# Patient Record
Sex: Male | Born: 1965 | Race: Black or African American | Hispanic: No | Marital: Single | Smoking: Never smoker
Health system: Southern US, Community
[De-identification: ages and names within clinical notes are randomized; demographics above are authoritative.]

## PROBLEM LIST (undated history)

## (undated) DIAGNOSIS — E669 Obesity, unspecified: Secondary | ICD-10-CM

## (undated) DIAGNOSIS — E119 Type 2 diabetes mellitus without complications: Secondary | ICD-10-CM

## (undated) DIAGNOSIS — F32A Depression, unspecified: Secondary | ICD-10-CM

## (undated) DIAGNOSIS — G629 Polyneuropathy, unspecified: Secondary | ICD-10-CM

## (undated) DIAGNOSIS — I10 Essential (primary) hypertension: Secondary | ICD-10-CM

## (undated) DIAGNOSIS — F329 Major depressive disorder, single episode, unspecified: Secondary | ICD-10-CM

## (undated) DIAGNOSIS — M549 Dorsalgia, unspecified: Secondary | ICD-10-CM

## (undated) HISTORY — PX: CHOLECYSTECTOMY: SHX55

---

## 2014-07-31 ENCOUNTER — Ambulatory Visit: Payer: Self-pay | Admitting: Family Medicine

## 2014-07-31 LAB — URINALYSIS, COMPLETE
BACTERIA: NEGATIVE
Bilirubin,UR: NEGATIVE
Blood: NEGATIVE
Glucose,UR: >=1000
Ketone: NEGATIVE
Leukocyte Esterase: NEGATIVE
NITRITE: NEGATIVE
PROTEIN: NEGATIVE
Ph: 6.5 (ref 5.0–8.0)
SPECIFIC GRAVITY: 1.015 (ref 1.000–1.030)
SQUAMOUS EPITHELIAL: NONE SEEN
WBC UR: NONE SEEN /HPF (ref 0–5)

## 2014-08-02 LAB — URINE CULTURE

## 2014-09-13 ENCOUNTER — Ambulatory Visit: Payer: Self-pay | Admitting: Family Medicine

## 2014-09-13 LAB — URINALYSIS, COMPLETE
BACTERIA: NEGATIVE
BILIRUBIN, UR: NEGATIVE
BLOOD: NEGATIVE
KETONE: NEGATIVE
Leukocyte Esterase: NEGATIVE
NITRITE: NEGATIVE
Ph: 5.5 (ref 5.0–8.0)
Protein: NEGATIVE
RBC, UR: NONE SEEN /HPF (ref 0–5)
SPECIFIC GRAVITY: 1.005 (ref 1.000–1.030)
Squamous Epithelial: NONE SEEN

## 2015-01-22 ENCOUNTER — Ambulatory Visit
Admit: 2015-01-22 | Disposition: A | Discharge status: Home / Self Care | Payer: Self-pay | Attending: Family Medicine | Admitting: Family Medicine

## 2015-01-29 ENCOUNTER — Encounter: Payer: Self-pay | Admitting: Emergency Medicine

## 2015-01-29 ENCOUNTER — Ambulatory Visit
Admission: EM | Admit: 2015-01-29 | Discharge: 2015-01-29 | Disposition: A | Discharge status: Home / Self Care | Payer: BLUE CROSS/BLUE SHIELD | Attending: Family Medicine | Admitting: Family Medicine

## 2015-01-29 DIAGNOSIS — M5432 Sciatica, left side: Secondary | ICD-10-CM

## 2015-01-29 HISTORY — DX: Obesity, unspecified: E66.9

## 2015-01-29 HISTORY — DX: Dorsalgia, unspecified: M54.9

## 2015-01-29 HISTORY — DX: Essential (primary) hypertension: I10

## 2015-01-29 HISTORY — DX: Type 2 diabetes mellitus without complications: E11.9

## 2015-01-29 LAB — CBG MONITORING, ED: WEAK D: 247

## 2015-01-29 MED ORDER — KETOROLAC TROMETHAMINE 60 MG/2ML IM SOLN
60.0000 mg | Freq: Once | INTRAMUSCULAR | Status: AC
  Administered 2015-01-29: 60 mg via INTRAMUSCULAR
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
## 2015-01-29 NOTE — Discharge Instructions (Signed)

## 2015-07-25 ENCOUNTER — Encounter: Payer: Self-pay | Admitting: Physical Medicine & Rehabilitation

## 2015-08-16 ENCOUNTER — Encounter: Payer: BLUE CROSS/BLUE SHIELD | Admitting: Physical Medicine & Rehabilitation

## 2015-09-04 ENCOUNTER — Encounter: Payer: Self-pay | Admitting: Physical Medicine & Rehabilitation

## 2015-10-05 ENCOUNTER — Encounter
Payer: BLUE CROSS/BLUE SHIELD | Attending: Physical Medicine & Rehabilitation | Admitting: Physical Medicine & Rehabilitation

## 2016-08-17 ENCOUNTER — Emergency Department (HOSPITAL_BASED_OUTPATIENT_CLINIC_OR_DEPARTMENT_OTHER)
Admission: EM | Admit: 2016-08-17 | Discharge: 2016-08-18 | Disposition: A | Discharge status: Home / Self Care | Payer: Worker's Compensation | Attending: Emergency Medicine | Admitting: Emergency Medicine

## 2016-08-17 ENCOUNTER — Emergency Department (HOSPITAL_BASED_OUTPATIENT_CLINIC_OR_DEPARTMENT_OTHER): Payer: Worker's Compensation

## 2016-08-17 ENCOUNTER — Encounter (HOSPITAL_BASED_OUTPATIENT_CLINIC_OR_DEPARTMENT_OTHER): Payer: Self-pay | Admitting: Emergency Medicine

## 2016-08-17 DIAGNOSIS — Y9389 Activity, other specified: Secondary | ICD-10-CM | POA: Diagnosis not present

## 2016-08-17 DIAGNOSIS — Y999 Unspecified external cause status: Secondary | ICD-10-CM | POA: Diagnosis not present

## 2016-08-17 DIAGNOSIS — Y92009 Unspecified place in unspecified non-institutional (private) residence as the place of occurrence of the external cause: Secondary | ICD-10-CM | POA: Diagnosis not present

## 2016-08-17 DIAGNOSIS — E119 Type 2 diabetes mellitus without complications: Secondary | ICD-10-CM | POA: Insufficient documentation

## 2016-08-17 DIAGNOSIS — Z77098 Contact with and (suspected) exposure to other hazardous, chiefly nonmedicinal, chemicals: Secondary | ICD-10-CM

## 2016-08-17 DIAGNOSIS — I10 Essential (primary) hypertension: Secondary | ICD-10-CM | POA: Diagnosis not present

## 2016-08-17 DIAGNOSIS — R0789 Other chest pain: Secondary | ICD-10-CM | POA: Insufficient documentation

## 2016-08-17 DIAGNOSIS — H5711 Ocular pain, right eye: Secondary | ICD-10-CM | POA: Diagnosis present

## 2016-08-17 MED ORDER — TETRACAINE HCL 0.5 % OP SOLN
2.0000 [drp] | Freq: Once | OPHTHALMIC | Status: AC
  Administered 2016-08-17: 2 [drp] via OPHTHALMIC

## 2016-08-17 MED ORDER — FLUORESCEIN SODIUM 1 MG OP STRP
1.0000 | ORAL_STRIP | Freq: Once | OPHTHALMIC | Status: AC
  Administered 2016-08-17: 1 via OPHTHALMIC

## 2016-08-17 MED ORDER — TETRACAINE HCL 0.5 % OP SOLN
OPHTHALMIC | Status: AC
  Filled 2016-08-17: qty 4

## 2016-08-17 MED ORDER — FLUORESCEIN SODIUM 1 MG OP STRP
ORAL_STRIP | OPHTHALMIC | Status: AC
  Filled 2016-08-17: qty 1

## 2016-08-17 MED ORDER — ERYTHROMYCIN 5 MG/GM OP OINT
TOPICAL_OINTMENT | Freq: Once | OPHTHALMIC | Status: AC
  Administered 2016-08-18: 1 via OPHTHALMIC
  Filled 2016-08-17: qty 3.5
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
## 2016-08-18 NOTE — Discharge Instructions (Signed)
Use eye ointment 4 times daily applying 1in to lower lid for 1 week or for the duration you have symptoms.

## 2016-11-11 ENCOUNTER — Ambulatory Visit
Admission: EM | Admit: 2016-11-11 | Discharge: 2016-11-11 | Disposition: A | Discharge status: Home / Self Care | Payer: BLUE CROSS/BLUE SHIELD | Attending: Family Medicine | Admitting: Family Medicine

## 2016-11-11 ENCOUNTER — Encounter: Payer: Self-pay | Admitting: *Deleted

## 2016-11-11 DIAGNOSIS — J0100 Acute maxillary sinusitis, unspecified: Secondary | ICD-10-CM | POA: Diagnosis not present

## 2016-11-11 DIAGNOSIS — J069 Acute upper respiratory infection, unspecified: Secondary | ICD-10-CM

## 2016-11-11 MED ORDER — AMOXICILLIN-POT CLAVULANATE 875-125 MG PO TABS: 1.0000 | ORAL_TABLET | Freq: Two times a day (BID) | ORAL | 0 refills | Status: DC

## 2016-11-11 MED ORDER — FEXOFENADINE-PSEUDOEPHED ER 180-240 MG PO TB24: 1.0000 | ORAL_TABLET | Freq: Every day | ORAL | 0 refills | Status: AC

## 2016-11-11 MED ORDER — FEXOFENADINE-PSEUDOEPHED ER 180-240 MG PO TB24: 1.0000 | ORAL_TABLET | Freq: Every day | ORAL | 0 refills | Status: DC

## 2016-11-11 NOTE — ED Triage Notes (Signed)
Patient started having symptoms of cough, nasal congestion, and weakness 1 week ago. Patient went to his PCP 6 days ago and was given cough suppressant medical. Patient still has symptoms that have not improved.
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
## 2016-11-12 ENCOUNTER — Telehealth: Payer: Self-pay

## 2016-11-12 MED ORDER — AMOXICILLIN 875 MG PO TABS: 875.0000 mg | ORAL_TABLET | Freq: Two times a day (BID) | ORAL | 0 refills | Status: DC

## 2016-11-12 NOTE — Telephone Encounter (Signed)
Patient called today stating that he could not afford his augmentin and would like to have amoxicillin sent in to pharmacy secondary to cost. Dr. Judd Gaudieronty approved this change.

## 2017-05-21 ENCOUNTER — Encounter: Payer: Self-pay | Admitting: Emergency Medicine

## 2017-05-21 ENCOUNTER — Emergency Department
Admission: EM | Admit: 2017-05-21 | Discharge: 2017-05-21 | Disposition: A | Discharge status: Home / Self Care | Payer: Managed Care, Other (non HMO) | Attending: Emergency Medicine | Admitting: Emergency Medicine

## 2017-05-21 DIAGNOSIS — Z79899 Other long term (current) drug therapy: Secondary | ICD-10-CM | POA: Insufficient documentation

## 2017-05-21 DIAGNOSIS — R531 Weakness: Secondary | ICD-10-CM | POA: Diagnosis not present

## 2017-05-21 DIAGNOSIS — Z794 Long term (current) use of insulin: Secondary | ICD-10-CM | POA: Insufficient documentation

## 2017-05-21 DIAGNOSIS — F329 Major depressive disorder, single episode, unspecified: Secondary | ICD-10-CM | POA: Diagnosis not present

## 2017-05-21 DIAGNOSIS — R739 Hyperglycemia, unspecified: Secondary | ICD-10-CM

## 2017-05-21 DIAGNOSIS — E1165 Type 2 diabetes mellitus with hyperglycemia: Secondary | ICD-10-CM | POA: Insufficient documentation

## 2017-05-21 DIAGNOSIS — I10 Essential (primary) hypertension: Secondary | ICD-10-CM | POA: Insufficient documentation

## 2017-05-21 HISTORY — DX: Depression, unspecified: F32.A

## 2017-05-21 HISTORY — DX: Polyneuropathy, unspecified: G62.9

## 2017-05-21 HISTORY — DX: Major depressive disorder, single episode, unspecified: F32.9

## 2017-05-21 LAB — CBC
HCT: 43.6 % (ref 40.0–52.0)
Hemoglobin: 15.2 g/dL (ref 13.0–18.0)
MCH: 30.6 pg (ref 26.0–34.0)
MCHC: 34.9 g/dL (ref 32.0–36.0)
MCV: 87.6 fL (ref 80.0–100.0)
Platelets: 231 10*3/uL (ref 150–440)
RBC: 4.98 MIL/uL (ref 4.40–5.90)
RDW: 13.5 % (ref 11.5–14.5)
WBC: 8.5 10*3/uL (ref 3.8–10.6)

## 2017-05-21 LAB — COMPREHENSIVE METABOLIC PANEL
ALT: 24 U/L (ref 17–63)
AST: 21 U/L (ref 15–41)
Albumin: 3.8 g/dL (ref 3.5–5.0)
Alkaline Phosphatase: 90 U/L (ref 38–126)
Anion gap: 7 (ref 5–15)
BUN: 12 mg/dL (ref 6–20)
CHLORIDE: 103 mmol/L (ref 101–111)
CO2: 25 mmol/L (ref 22–32)
CREATININE: 0.91 mg/dL (ref 0.61–1.24)
Calcium: 8.9 mg/dL (ref 8.9–10.3)
Glucose, Bld: 308 mg/dL — ABNORMAL HIGH (ref 65–99)
Potassium: 3.9 mmol/L (ref 3.5–5.1)
Sodium: 135 mmol/L (ref 135–145)
Total Bilirubin: 0.7 mg/dL (ref 0.3–1.2)
Total Protein: 7.4 g/dL (ref 6.5–8.1)

## 2017-05-21 LAB — GLUCOSE, CAPILLARY: GLUCOSE-CAPILLARY: 291 mg/dL — AB (ref 65–99)

## 2017-05-21 MED ORDER — HYDROCHLOROTHIAZIDE 12.5 MG PO CAPS
12.5000 mg | ORAL_CAPSULE | Freq: Once | ORAL | Status: AC
  Administered 2017-05-21: 12.5 mg via ORAL

## 2017-05-21 MED ORDER — ENALAPRIL MALEATE 10 MG PO TABS: 10.0000 mg | ORAL_TABLET | Freq: Every day | ORAL | 1 refills | Status: DC

## 2017-05-21 MED ORDER — METFORMIN HCL 500 MG PO TABS: 500.0000 mg | ORAL_TABLET | Freq: Two times a day (BID) | ORAL | 1 refills | Status: AC

## 2017-05-21 MED ORDER — HYDROCHLOROTHIAZIDE 12.5 MG PO CAPS
ORAL_CAPSULE | ORAL | Status: AC
  Administered 2017-05-21: 12.5 mg via ORAL
  Filled 2017-05-21: qty 1

## 2017-05-21 MED ORDER — ENALAPRIL-HYDROCHLOROTHIAZIDE 10-25 MG PO TABS: 1.0000 | ORAL_TABLET | Freq: Every day | ORAL | 0 refills | Status: AC

## 2017-05-21 MED ORDER — METFORMIN HCL 500 MG PO TABS
500.0000 mg | ORAL_TABLET | Freq: Once | ORAL | Status: AC
  Administered 2017-05-21: 500 mg via ORAL
  Filled 2017-05-21: qty 1

## 2017-05-21 MED ORDER — LOVASTATIN 10 MG PO TABS: 10.0000 mg | ORAL_TABLET | Freq: Every day | ORAL | 1 refills | Status: AC

## 2017-05-21 MED ORDER — ENALAPRIL MALEATE 10 MG PO TABS
10.0000 mg | ORAL_TABLET | Freq: Once | ORAL | Status: AC
  Administered 2017-05-21: 10 mg via ORAL
  Filled 2017-05-21: qty 1

## 2017-05-21 MED ORDER — SODIUM CHLORIDE 0.9 % IV BOLUS (SEPSIS)
1000.0000 mL | Freq: Once | INTRAVENOUS | Status: AC
  Administered 2017-05-21: 1000 mL via INTRAVENOUS

## 2017-05-21 NOTE — ED Notes (Signed)
Reviewed d/c instructions, follow-up care, prescriptions with patient. Pt verbalized understanding.  Patient told to continue both insulin and metformin per MD Dolores Frame
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
## 2017-05-21 NOTE — ED Provider Notes (Addendum)
[facility] Emergency Department Provider Note  Time seen: 9:00 PM  I have reviewed the triage vital signs and the nursing notes.   HISTORY  Chief Complaint Hyperglycemia and Otalgia    HPI Jason Clarke is a 51 y.o. male with a past medical history of diabetes, depression, hypertension, presents to the emergency department for multiple complaints. According to the patient he has been off his blood pressure and diabetic medications for the past 3 months as he did not have insurance but now does. States he has been feeling somewhat weak and fatigued over the past several days. He has been urinating very frequently over the past few days. He also states over the past 1-2 weeks he has felt like his depression has increased although denies any SI or HI. Patient denies any chest pain, shortness breath abdominal pain, nausea vomiting or diarrhea. Denies dysuria. Patient states the main reason he came was to get his medications filled and to be evaluated for his high blood sugar. When asked if he wants to speak with someone regarding his depression, he says that he just wants to get a psychiatrist to see as an outpatient.  Past Medical History:  Diagnosis Date  . Back pain   . Depression   . Diabetes mellitus without complication (HCC)   . Hypertension   . Neuropathy   . Obesity     There are no active problems to display for this patient.   Past Surgical History:  Procedure Laterality Date  . CHOLECYSTECTOMY      Prior to Admission medications   Medication Sig Start Date End Date Taking? Authorizing Provider  amoxicillin (AMOXIL) 875 MG tablet Take 1 tablet (875 mg total) by mouth 2 (two) times daily. 11/12/16   Payton Mccallum, MD  amoxicillin-clavulanate (AUGMENTIN) 875-125 MG tablet Take 1 tablet by mouth 2 (two) times daily. 11/11/16   Hassan Rowan, MD  aspirin 81 MG tablet Take 81 mg by mouth daily.    [provider]  enalapril (VASOTEC) 10 MG  tablet Take 10 mg by mouth daily.    [provider]  fexofenadine-pseudoephedrine (ALLEGRA-D ALLERGY & CONGESTION) 180-240 MG 24 hr tablet Take 1 tablet by mouth daily. 11/11/16   Hassan Rowan, MD  fluticasone Aleda Grana) 50 MCG/ACT nasal spray Place into both nostrils daily.    [provider]  furosemide (LASIX) 10 MG/ML solution Take by mouth daily.    [provider]  insulin glargine (LANTUS) 100 UNIT/ML injection Inject into the skin at bedtime.    [provider]  loratadine (CLARITIN) 10 MG tablet Take 10 mg by mouth daily.    [provider]  lovastatin (MEVACOR) 10 MG tablet Take 10 mg by mouth at bedtime.    [provider]  metFORMIN (GLUCOPHAGE) 500 MG tablet Take by mouth 2 (two) times daily with a meal.    [provider]    No Known Allergies  Family History  Problem Relation Age of Onset  . Diabetes Mother   . Hypertension Mother   . Diabetes Father   . Hypertension Father   . Diabetes Maternal Grandmother   . Hypertension Maternal Grandmother   . Diabetes Paternal Grandmother   . Hypertension Paternal Grandmother     Social History Social History  Substance Use Topics  . Smoking status: Never Smoker  . Smokeless tobacco: Never Used  . Alcohol use Yes     Comment: occ    Review of Systems Constitutional: Negative for  fever. Positive for generalized fatigue/weakness. Cardiovascular: Negative for chest pain. Respiratory: Negative for shortness of breath. Gastrointestinal: Negative for abdominal pain Genitourinary: Negative for dysuria Neurological: Negative for headache All other ROS negative  ____________________________________________   PHYSICAL EXAM:  VITAL SIGNS: ED Triage Vitals  Enc Vitals Group     BP 05/21/17 2044 (!) 202/110     Pulse Rate 05/21/17 2040 79     Resp 05/21/17 2040 18     Temp 05/21/17 2040 98.6 F (37 C)     Temp Source 05/21/17 2040 Oral     SpO2 05/21/17 2040  97 %     Weight 05/21/17 2040 280 lb (127 kg)     Height 05/21/17 2040 5\' 9"  (1.753 m)     Head Circumference --      Peak Flow --      Pain Score 05/21/17 2040 6     Pain Loc --      Pain Edu? --      Excl. in GC? --     Constitutional: Alert and oriented. Well appearing and in no distress. Eyes: Normal exam ENT   Head: Normocephalic and atraumatic.   Mouth/Throat: Mucous membranes are moist.Normal appearing right tympanic membrane. Cardiovascular: Normal rate, regular rhythm. No murmur Respiratory: Normal respiratory effort without tachypnea nor retractions. Breath sounds are clear  Gastrointestinal: Soft and nontender. No distention.   Musculoskeletal: Nontender with normal range of motion in all extremities.  Neurologic:  Normal speech and language. No gross focal neurologic deficits  Skin:  Skin is warm, dry and intact.  Psychiatric: Mood and affect are normal.   ____________________________________________    EKG  EKG reviewed and interpreted by myself shows normal sinus rhythm at 73 bpm, narrow QRS, normal axis, normal intervals, no concerning ST changes.   INITIAL IMPRESSION / ASSESSMENT AND PLAN / ED COURSE  Pertinent labs & imaging results that were available during my care of the patient were reviewed by me and considered in my medical decision making (see chart for details).  Patient presents to the emergency department for multiple complaints including hyperglycemia, weakness, hypertension, off of his medications, also states depression wishing for referrals to psychiatry as well as primary care. Overall the patient appears extremely well, no distress. Normal physical examination. Patient is hypertensive, we will restart blood pressure medications. We will check labs IV hydrate and continue to closely monitor. I very clearly asked the patient if he wishes to speak to a psychiatrist in the emergency department, he states he does not and only wishes for a  referral to an outpatient psychiatrist as well as a primary care doctor now that he has insurance. Clearly denies SI or HI.  Patient appears well. Labs are largely within normal limits with hyperglycemia. We will restart metformin as well as blood pressure medications and have him follow up with [facility] clinic. Patient is agreeable to plan.  ____________________________________________   FINAL CLINICAL IMPRESSION(S) / ED DIAGNOSES  Hyperglycemia Hypertension Depression    Minna Antis, MD 05/21/17 2255    Minna Antis, MD 05/21/17 2303

## 2017-05-21 NOTE — ED Triage Notes (Signed)
Patient here stating that his blood sugar has been in the 300's for over a month. Patient states that he has a history of depression and feels like he needs to see someone for it. Patient denies SI.  Patient states that he has not been taking his medication for DM or depression because he has not had insurance.

## 2017-05-29 ENCOUNTER — Ambulatory Visit
Admission: EM | Admit: 2017-05-29 | Discharge: 2017-05-29 | Disposition: A | Discharge status: Home / Self Care | Payer: Managed Care, Other (non HMO) | Attending: Family Medicine | Admitting: Family Medicine

## 2017-05-29 DIAGNOSIS — R739 Hyperglycemia, unspecified: Secondary | ICD-10-CM | POA: Diagnosis not present

## 2017-05-29 DIAGNOSIS — I10 Essential (primary) hypertension: Secondary | ICD-10-CM

## 2017-05-29 DIAGNOSIS — L02229 Furuncle of trunk, unspecified: Secondary | ICD-10-CM

## 2017-05-29 MED ORDER — ENALAPRIL MALEATE 10 MG PO TABS: 10.0000 mg | ORAL_TABLET | Freq: Every day | ORAL | 0 refills | Status: DC

## 2017-05-29 MED ORDER — DOXYCYCLINE HYCLATE 100 MG PO TABS: 100.0000 mg | ORAL_TABLET | Freq: Two times a day (BID) | ORAL | 0 refills | Status: DC

## 2017-05-29 NOTE — ED Triage Notes (Signed)
Pt reports boil to left lower back that is painful. Pt also reports CBG 200-300 for the several weeks and BP running higher than normal. Pt endorses generalized fatigue

## 2017-05-29 NOTE — ED Provider Notes (Signed)
[facility]    CSN: [id number] Arrival date & time: 05/29/17  1835     History   Chief Complaint Chief Complaint  Patient presents with  . Abscess  . Hyperglycemia  . Hypertension    HPI Jason Clarke is a 51 y.o. male.   51 yo diabetic male with a c/o boil on his lower back and elevated blood pressure readings. States boil on lower back drained some pus today. Denies any fevers or chills.    The history is provided by the patient.  Abscess  Hyperglycemia  Hypertension     Past Medical History:  Diagnosis Date  . Back pain   . Depression   . Diabetes mellitus without complication (HCC)   . Hypertension   . Neuropathy   . Obesity     There are no active problems to display for this patient.   Past Surgical History:  Procedure Laterality Date  . CHOLECYSTECTOMY         Home Medications    Prior to Admission medications   Medication Sig Start Date End Date Taking? Authorizing Provider  amoxicillin (AMOXIL) 875 MG tablet Take 1 tablet (875 mg total) by mouth 2 (two) times daily. 11/12/16   Payton Mccallum, MD  amoxicillin-clavulanate (AUGMENTIN) 875-125 MG tablet Take 1 tablet by mouth 2 (two) times daily. 11/11/16   Hassan Rowan, MD  aspirin 81 MG tablet Take 81 mg by mouth daily.    [provider]  doxycycline (VIBRA-TABS) 100 MG tablet Take 1 tablet (100 mg total) by mouth 2 (two) times daily. 05/29/17   Payton Mccallum, MD  enalapril (VASOTEC) 10 MG tablet Take 1 tablet (10 mg total) by mouth daily. 05/29/17   Payton Mccallum, MD  enalapril-hydrochlorothiazide (VASERETIC) 10-25 MG tablet Take 1 tablet by mouth daily. 05/21/17   Irean Hong, MD  fexofenadine-pseudoephedrine (ALLEGRA-D ALLERGY & CONGESTION) 180-240 MG 24 hr tablet Take 1 tablet by mouth daily. 11/11/16   Hassan Rowan, MD  fluticasone Aleda Grana) 50 MCG/ACT nasal spray Place into both nostrils daily.    [provider]  furosemide (LASIX) 10 MG/ML solution Take by  mouth daily.    [provider]  insulin glargine (LANTUS) 100 UNIT/ML injection Inject into the skin at bedtime.    [provider]  loratadine (CLARITIN) 10 MG tablet Take 10 mg by mouth daily.    [provider]  lovastatin (MEVACOR) 10 MG tablet Take 1 tablet (10 mg total) by mouth at bedtime. 05/21/17   Minna Antis, MD  metFORMIN (GLUCOPHAGE) 500 MG tablet Take 1 tablet (500 mg total) by mouth 2 (two) times daily with a meal. 05/21/17   Minna Antis, MD    Family History Family History  Problem Relation Age of Onset  . Diabetes Mother   . Hypertension Mother   . Diabetes Father   . Hypertension Father   . Diabetes Maternal Grandmother   . Hypertension Maternal Grandmother   . Diabetes Paternal Grandmother   . Hypertension Paternal Grandmother     Social History Social History  Substance Use Topics  . Smoking status: Never Smoker  . Smokeless tobacco: Never Used  . Alcohol use Yes     Comment: occ     Allergies   Patient has no known allergies.   Review of Systems Review of Systems   Physical Exam Triage Vital Signs ED Triage Vitals  Enc Vitals Group     BP 05/29/17 1857 (!) 167/92     Pulse  Rate 05/29/17 1857 77     Resp 05/29/17 1857 16     Temp 05/29/17 1857 98.2 F (36.8 C)     Temp src --      SpO2 05/29/17 1857 100 %     Weight 05/29/17 1857 283 lb (128.4 kg)     Height --      Head Circumference --      Peak Flow --      Pain Score 05/29/17 1924 0     Pain Loc --      Pain Edu? --      Excl. in GC? --    No data found.   Updated Vital Signs BP (!) 167/92   Pulse 77   Temp 98.2 F (36.8 C)   Resp 16   Wt 283 lb (128.4 kg)   SpO2 100%   BMI 41.79 kg/m   Visual Acuity Right Eye Distance:   Left Eye Distance:   Bilateral Distance:    Right Eye Near:   Left Eye Near:    Bilateral Near:     Physical Exam  Constitutional: He appears well-developed and well-nourished. No distress.    Cardiovascular: Normal rate, regular rhythm, normal heart sounds and intact distal pulses.   No murmur heard. Pulmonary/Chest: Effort normal and breath sounds normal. No respiratory distress. He has no wheezes. He has no rales.  Skin: He is not diaphoretic.  3cm slightly raised, tender, erythematous, blanchable skin lesion on lower back; no drainage  Nursing note and vitals reviewed.    UC Treatments / Results  Labs (all labs ordered are listed, but only abnormal results are displayed) Labs Reviewed - No data to display  EKG  EKG Interpretation None       Radiology No results found.  Procedures Procedures (including critical care time)  Medications Ordered in UC Medications - No data to display   Initial Impression / Assessment and Plan / UC Course  I have reviewed the triage vital signs and the nursing notes.  Pertinent labs & imaging results that were available during my care of the patient were reviewed by me and considered in my medical decision making (see chart for details).       Final Clinical Impressions(s) / UC Diagnoses   Final diagnoses:  Boil of trunk  Hyperglycemia  Essential hypertension    New Prescriptions Discharge Medication List as of 05/29/2017  7:19 PM    START taking these medications   Details  doxycycline (VIBRA-TABS) 100 MG tablet Take 1 tablet (100 mg total) by mouth 2 (two) times daily., Starting Fri 05/29/2017, Normal      1. diagnosis reviewed with patient 2. rx as per orders above; reviewed possible side effects, interactions, risks and benefits  3. Recommend add 10mg  enalapril to current meds 4. Recommend supportive treatment with warm compresses to affected skin area on lower back 4. Follow-up with PCP   Controlled Substance Prescriptions  Controlled Substance Registry consulted? Not Applicable   Payton Mccallumonty, Camylle Whicker, MD 05/29/17 [phone]

## 2017-05-29 NOTE — Discharge Instructions (Signed)
Warm compresses to affected area °

## 2017-07-09 ENCOUNTER — Ambulatory Visit
Admission: EM | Admit: 2017-07-09 | Discharge: 2017-07-09 | Disposition: A | Discharge status: Home / Self Care | Payer: Managed Care, Other (non HMO) | Attending: Family Medicine | Admitting: Family Medicine

## 2017-07-09 ENCOUNTER — Encounter: Payer: Self-pay | Admitting: Emergency Medicine

## 2017-07-09 DIAGNOSIS — R0789 Other chest pain: Secondary | ICD-10-CM | POA: Diagnosis not present

## 2017-07-09 DIAGNOSIS — R109 Unspecified abdominal pain: Secondary | ICD-10-CM | POA: Diagnosis not present

## 2017-07-09 DIAGNOSIS — K859 Acute pancreatitis without necrosis or infection, unspecified: Secondary | ICD-10-CM | POA: Diagnosis not present

## 2017-07-09 NOTE — Discharge Instructions (Signed)
Go to the [facility] ER.  Take care  Dr. Adriana Simas

## 2017-07-09 NOTE — ED Triage Notes (Signed)
Patient was seen at [facility] ED yesterday for this same pain.  Patient states that he is still having that abdominal pain, chest and jaw pain.  Patient states that he was told that he had an elevated lipase level.  Patient thinks he might need an ultrasound.

## 2017-07-09 NOTE — ED Provider Notes (Signed)
[facility]    CSN: [id number] Arrival date & time: 07/09/17  1205  History   Chief Complaint Chief Complaint  Patient presents with  . Abdominal Pain  . Chest Pain  . Jaw Pain   HPI  51 year old male presents with the above complaints.  Patient reports a one-week history of chest pain. He reports associated radiation to the neck and jaw. He was seen at [facility] ER and [facility] yesterday. He endorses to me that he's had ongoing abdominal discomfort as well. This is not mentioned in the notes. He had negative troponins 2. EKG did not reveal any signs of ischemia. Laboratory studies revealed elevated glucose and a markedly elevated lipase of 758. Note states that he had no epigastric pain on exam. Therefore he was discharged home. Patient reports that he continues to have ongoing chest discomfort and jaw pain. He also states that he's having abdominal pain. He has not had a bowel movement in 2 days. No fever or chills. No other associated symptoms. Pain is 8 out of 10 in severity. No relieving factors. No other complaints or concerns at this time.  Past Medical History:  Diagnosis Date  . Back pain   . Depression   . Diabetes mellitus without complication (HCC)   . Hypertension   . Neuropathy   . Obesity    Past Surgical History:  Procedure Laterality Date  . CHOLECYSTECTOMY      Home Medications    Prior to Admission medications   Medication Sig Start Date End Date Taking? Authorizing Provider  aspirin 81 MG tablet Take 81 mg by mouth daily.    [provider]  enalapril-hydrochlorothiazide (VASERETIC) 10-25 MG tablet Take 1 tablet by mouth daily. 05/21/17   Irean Hong, MD  fexofenadine-pseudoephedrine (ALLEGRA-D ALLERGY & CONGESTION) 180-240 MG 24 hr tablet Take 1 tablet by mouth daily. 11/11/16   Hassan Rowan, MD  fluticasone Aleda Grana) 50 MCG/ACT nasal spray Place into both nostrils daily.    [provider]  insulin glargine (LANTUS) 100 UNIT/ML  injection Inject 20 Units into the skin 2 (two) times daily.     [provider]  loratadine (CLARITIN) 10 MG tablet Take 10 mg by mouth daily.    [provider]  lovastatin (MEVACOR) 10 MG tablet Take 1 tablet (10 mg total) by mouth at bedtime. 05/21/17   Minna Antis, MD  metFORMIN (GLUCOPHAGE) 500 MG tablet Take 1 tablet (500 mg total) by mouth 2 (two) times daily with a meal. Patient taking differently: Take 1,000 mg by mouth 2 (two) times daily with a meal.  05/21/17   Minna Antis, MD   Family History Family History  Problem Relation Age of Onset  . Diabetes Mother   . Hypertension Mother   . Diabetes Father   . Hypertension Father   . Diabetes Maternal Grandmother   . Hypertension Maternal Grandmother   . Diabetes Paternal Grandmother   . Hypertension Paternal Grandmother     Social History Social History  Substance Use Topics  . Smoking status: Never Smoker  . Smokeless tobacco: Never Used  . Alcohol use Yes     Comment: occ   Allergies   Patient has no known allergies.  Review of Systems Review of Systems  HENT:       Jaw pain.  Cardiovascular: Positive for chest pain.  Gastrointestinal: Positive for abdominal pain.   Physical Exam Triage Vital Signs ED Triage Vitals  Enc Vitals Group     BP  07/09/17 1222 119/88     Pulse Rate 07/09/17 1222 95     Resp 07/09/17 1222 16     Temp 07/09/17 1222 98.2 F (36.8 C)     Temp Source 07/09/17 1222 Oral     SpO2 07/09/17 1222 99 %     Weight 07/09/17 1219 278 lb (126.1 kg)     Height 07/09/17 1219  (1.753 m)     Head Circumference --      Peak Flow --      Pain Score 07/09/17 1219 8     Pain Loc --      Pain Edu? --      Excl. in GC? --    Updated Vital Signs BP 119/88 (BP Location: Right Arm)   Pulse 95   Temp 98.2 F (36.8 C) (Oral)   Resp 16   Ht  (1.753 m)   Wt 278 lb (126.1 kg)   SpO2 99%   BMI 41.05 kg/m   Physical Exam  Constitutional: He is oriented to  person, place, and time. He appears well-developed. No distress.  Cardiovascular: Normal rate and regular rhythm.   No murmur heard. Pulmonary/Chest: Effort normal and breath sounds normal. He has no wheezes. He has no rales.  Abdominal:  Soft, nondistended. Patient has marketed epigastric abdominal pain.  Neurological: He is alert and oriented to person, place, and time.  Psychiatric: He has a normal mood and affect.  Vitals reviewed.  UC Treatments / Results  Labs (all labs ordered are listed, but only abnormal results are displayed) Labs Reviewed - No data to display  EKG  EKG Interpretation None       Radiology No results found.  Procedures Procedures (including critical care time)  Medications Ordered in UC Medications - No data to display   Initial Impression / Assessment and Plan / UC Course  I have reviewed the triage vital signs and the nursing notes.  Pertinent labs & imaging results that were available during my care of the patient were reviewed by me and considered in my medical decision making (see chart for details).   51 year old male presents with chest pain, abdominal pain, jaw pain. Had a negative cardiac workup yesterday. He has significant epigastric abdominal pain. Had an elevated lipase at ER. I suspect that he has acute pancreatitis. Sending to the ER.  Final Clinical Impressions(s) / UC Diagnoses   Final diagnoses:  Acute pancreatitis, unspecified complication status, unspecified pancreatitis type    New Prescriptions New Prescriptions   No medications on file   Controlled Substance Prescriptions [place] Controlled Substance Registry consulted? Not Applicable   Tommie Sams, DO 07/09/17 1256

## 2017-12-23 ENCOUNTER — Emergency Department
Admission: EM | Admit: 2017-12-23 | Discharge: 2017-12-23 | Disposition: A | Discharge status: Home / Self Care | Payer: Managed Care, Other (non HMO) | Attending: Emergency Medicine | Admitting: Emergency Medicine

## 2017-12-23 ENCOUNTER — Other Ambulatory Visit: Payer: Self-pay

## 2017-12-23 DIAGNOSIS — Z7982 Long term (current) use of aspirin: Secondary | ICD-10-CM | POA: Insufficient documentation

## 2017-12-23 DIAGNOSIS — I10 Essential (primary) hypertension: Secondary | ICD-10-CM | POA: Insufficient documentation

## 2017-12-23 DIAGNOSIS — E119 Type 2 diabetes mellitus without complications: Secondary | ICD-10-CM | POA: Insufficient documentation

## 2017-12-23 DIAGNOSIS — J069 Acute upper respiratory infection, unspecified: Secondary | ICD-10-CM | POA: Insufficient documentation

## 2017-12-23 DIAGNOSIS — Z7984 Long term (current) use of oral hypoglycemic drugs: Secondary | ICD-10-CM | POA: Insufficient documentation

## 2017-12-23 DIAGNOSIS — Z79899 Other long term (current) drug therapy: Secondary | ICD-10-CM | POA: Insufficient documentation

## 2017-12-23 MED ORDER — FLUTICASONE PROPIONATE 50 MCG/ACT NA SUSP: 2.0000 | Freq: Every day | NASAL | 0 refills | Status: AC

## 2017-12-23 MED ORDER — AZITHROMYCIN 250 MG PO TABS: ORAL_TABLET | ORAL | 0 refills | Status: AC

## 2017-12-23 MED ORDER — ACETAMINOPHEN-CODEINE #3 300-30 MG PO TABS: 1.0000 | ORAL_TABLET | Freq: Four times a day (QID) | ORAL | 0 refills | Status: AC | PRN

## 2017-12-23 MED ORDER — BENZONATATE 100 MG PO CAPS: ORAL_CAPSULE | ORAL | 0 refills | Status: AC

## 2017-12-23 NOTE — Discharge Instructions (Signed)
Take the prescription meds as directed. Your symptoms likely represent a viral URI. See your provider for ongoing symptoms.

## 2017-12-23 NOTE — ED Triage Notes (Signed)
Pt to ED c/o of cough sore throat body aches x4-5days ago. Pt recent exposure to sick people. Pt ambulatory.

## 2017-12-23 NOTE — ED Provider Notes (Signed)
[facility] Emergency Department Provider Note ____________________________________________  Time seen: 1310  I have reviewed the triage vital signs and the nursing notes.  HISTORY  Chief Complaint  URI  HPI Jason Clarke is a 52 y.o. male presents to the ED for evaluation of flulike symptoms.  Patient describes symptoms have been persistent for the last 4-5 days.  He has been taking over-the-counter cough medicines, Mucinex, Flonase, and his albuterol.  He describes body aches, congestion, ear fullness, sore throat, cough, and intermittent wheeze last night.  He denies any outright fevers.  He did receive the seasonal flu vaccine.  He has a medical history of mild intermittent asthma that he uses albuterol for as needed.  He denies any cough-induced vomiting, abdominal pain, or diarrhea.  He also denies any significant chest pain.  Past Medical History:  Diagnosis Date  . Back pain   . Depression   . Diabetes mellitus without complication (HCC)   . Hypertension   . Neuropathy   . Obesity     There are no active problems to display for this patient.   Past Surgical History:  Procedure Laterality Date  . CHOLECYSTECTOMY      Prior to Admission medications   Medication Sig Start Date End Date Taking? Authorizing Provider  acetaminophen-codeine (TYLENOL #3) 300-30 MG tablet Take 1 tablet by mouth every 6 (six) hours as needed for moderate pain. 12/23/17   Corrissa Martello, Charlesetta Ivory, PA-C  aspirin 81 MG tablet Take 81 mg by mouth daily.    [provider]  azithromycin (ZITHROMAX Z-PAK) 250 MG tablet Take 2 tablets (500 mg) on  Day 1,  followed by 1 tablet (250 mg) once daily on Days 2 through 5. 12/23/17   Mayukha Symmonds, Charlesetta Ivory, PA-C  benzonatate (TESSALON PERLES) 100 MG capsule Take 1-2 tabs TID prn cough 12/23/17   Jaquavion Mccannon, Charlesetta Ivory, PA-C  enalapril-hydrochlorothiazide (VASERETIC) 10-25 MG tablet Take 1 tablet by mouth daily. 05/21/17   Irean Hong, MD  fexofenadine-pseudoephedrine (ALLEGRA-D ALLERGY & CONGESTION) 180-240 MG 24 hr tablet Take 1 tablet by mouth daily. 11/11/16   Hassan Rowan, MD  fluticasone Aleda Grana) 50 MCG/ACT nasal spray Place into both nostrils daily.    [provider]  fluticasone (FLONASE) 50 MCG/ACT nasal spray Place 2 sprays into both nostrils daily. 12/23/17   Lareen Mullings, Charlesetta Ivory, PA-C  insulin glargine (LANTUS) 100 UNIT/ML injection Inject 20 Units into the skin 2 (two) times daily.     [provider]  loratadine (CLARITIN) 10 MG tablet Take 10 mg by mouth daily.    [provider]  lovastatin (MEVACOR) 10 MG tablet Take 1 tablet (10 mg total) by mouth at bedtime. 05/21/17   Minna Antis, MD  metFORMIN (GLUCOPHAGE) 500 MG tablet Take 1 tablet (500 mg total) by mouth 2 (two) times daily with a meal. Patient taking differently: Take 1,000 mg by mouth 2 (two) times daily with a meal.  05/21/17   Minna Antis, MD    Allergies Patient has no known allergies.  Family History  Problem Relation Age of Onset  . Diabetes Mother   . Hypertension Mother   . Diabetes Father   . Hypertension Father   . Diabetes Maternal Grandmother   . Hypertension Maternal Grandmother   . Diabetes Paternal Grandmother   . Hypertension Paternal Grandmother     Social History Social History   Tobacco Use  . Smoking status: Never Smoker  . Smokeless tobacco: Never  Used  Substance Use Topics  . Alcohol use: Not Currently    Comment: occ  . Drug use: No    Review of Systems  Constitutional: Negative for fever. Eyes: Negative for visual changes. ENT: Positive for sore throat and ear fullness. Cardiovascular: Negative for chest pain. Respiratory: Negative for shortness of breath.  Nonproductive cough. Gastrointestinal: Negative for abdominal pain, vomiting and diarrhea. Genitourinary: Negative for dysuria. Musculoskeletal: Negative for back pain. Skin: Negative for  rash. Neurological: Negative for headaches, focal weakness or numbness. ____________________________________________  PHYSICAL EXAM:  VITAL SIGNS: ED Triage Vitals  Enc Vitals Group     BP 12/23/17 1224 (!) 171/85     Pulse Rate 12/23/17 1224 78     Resp 12/23/17 1341 16     Temp 12/23/17 1224 98.5 F (36.9 C)     Temp Source 12/23/17 1224 Oral     SpO2 12/23/17 1224 99 %     Weight 12/23/17 1225 275 lb (124.7 kg)     Height 12/23/17 1225 5\' 9"  (1.753 m)     Head Circumference --      Peak Flow --      Pain Score 12/23/17 1225 8     Pain Loc --      Pain Edu? --      Excl. in GC? --     Constitutional: Alert and oriented. Well appearing and in no distress.  Patient is pleasant and loquacious. Head: Normocephalic and atraumatic. Eyes: Conjunctivae are normal. PERRL. Normal extraocular movements Ears: Canals clear. TMs intact bilaterally. Nose: No congestion/rhinorrhea/epistaxis. Mouth/Throat: Mucous membranes are moist.  Uvula is midline and tonsils are flat.  No oropharyngeal lesions are appreciated. Neck: Supple. No thyromegaly. Hematological/Lymphatic/Immunological: No cervical lymphadenopathy. Cardiovascular: Normal rate, regular rhythm. Normal distal pulses. Respiratory: Normal respiratory effort. No wheezes/rales/rhonchi. Gastrointestinal: Soft and nontender. No distention. ____________________________________________  INITIAL IMPRESSION / ASSESSMENT AND PLAN / ED COURSE  Patient with ED evaluation of symptoms consistent with a viral URI.  He does have a history of asthma and given the duration of his symptoms which may have represented an influenza early on in the course, there is concern for possible post viral pneumonia.  As such patient will be discharged with a prescription for a azithromycin.  He has also been to be discharged with Jerilynn Somessalon Perles, Flonase, and a few Tylenol with codeine for body ache, cough, fever relief.  He will follow-up with primary provider  for ongoing symptoms.  A work note is provided for 3 days as requested. ____________________________________________  FINAL CLINICAL IMPRESSION(S) / ED DIAGNOSES  Final diagnoses:  Viral upper respiratory tract infection      Karmen StabsMenshew, Charlesetta IvoryJenise V Bacon, PA-C 12/23/17 1914    Emily FilbertWilliams, Jonathan E, MD 12/26/17 [id number]
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
## 2018-04-23 ENCOUNTER — Encounter: Payer: Self-pay | Admitting: Emergency Medicine

## 2018-04-23 ENCOUNTER — Emergency Department
Admission: EM | Admit: 2018-04-23 | Discharge: 2018-04-23 | Disposition: A | Discharge status: Home / Self Care | Payer: Self-pay | Attending: Emergency Medicine | Admitting: Emergency Medicine

## 2018-04-23 ENCOUNTER — Other Ambulatory Visit: Payer: Self-pay

## 2018-04-23 DIAGNOSIS — R739 Hyperglycemia, unspecified: Secondary | ICD-10-CM

## 2018-04-23 DIAGNOSIS — E11649 Type 2 diabetes mellitus with hypoglycemia without coma: Secondary | ICD-10-CM | POA: Insufficient documentation

## 2018-04-23 DIAGNOSIS — Z79899 Other long term (current) drug therapy: Secondary | ICD-10-CM | POA: Insufficient documentation

## 2018-04-23 DIAGNOSIS — Z794 Long term (current) use of insulin: Secondary | ICD-10-CM | POA: Insufficient documentation

## 2018-04-23 DIAGNOSIS — R5383 Other fatigue: Secondary | ICD-10-CM

## 2018-04-23 DIAGNOSIS — E860 Dehydration: Secondary | ICD-10-CM

## 2018-04-23 DIAGNOSIS — I10 Essential (primary) hypertension: Secondary | ICD-10-CM | POA: Insufficient documentation

## 2018-04-23 LAB — URINALYSIS, COMPLETE (UACMP) WITH MICROSCOPIC
BACTERIA UA: NONE SEEN
BILIRUBIN URINE: NEGATIVE
Glucose, UA: 150 mg/dL — AB
Hgb urine dipstick: NEGATIVE
Ketones, ur: NEGATIVE mg/dL
Leukocytes, UA: NEGATIVE
Nitrite: NEGATIVE
PROTEIN: NEGATIVE mg/dL
Specific Gravity, Urine: 1.023 (ref 1.005–1.030)
pH: 7 (ref 5.0–8.0)

## 2018-04-23 LAB — BASIC METABOLIC PANEL
Anion gap: 7 (ref 5–15)
BUN: 10 mg/dL (ref 6–20)
CALCIUM: 8.6 mg/dL — AB (ref 8.9–10.3)
CHLORIDE: 106 mmol/L (ref 98–111)
CO2: 26 mmol/L (ref 22–32)
Creatinine, Ser: 0.84 mg/dL (ref 0.61–1.24)
GFR calc non Af Amer: >60 mL/min (ref >60)
Glucose, Bld: 174 mg/dL — ABNORMAL HIGH (ref 70–99)
POTASSIUM: 3.7 mmol/L (ref 3.5–5.1)
Sodium: 139 mmol/L (ref 135–145)

## 2018-04-23 LAB — RAPID HIV SCREEN (HIV 1/2 AB+AG)
HIV 1/2 ANTIBODIES: NONREACTIVE
HIV-1 P24 ANTIGEN - HIV24: NONREACTIVE

## 2018-04-23 LAB — CBC
HEMATOCRIT: 42.9 % (ref 40.0–52.0)
HEMOGLOBIN: 14.8 g/dL (ref 13.0–18.0)
MCH: 30.5 pg (ref 26.0–34.0)
MCHC: 34.6 g/dL (ref 32.0–36.0)
MCV: 88.3 fL (ref 80.0–100.0)
PLATELETS: 249 10*3/uL (ref 150–440)
RBC: 4.86 MIL/uL (ref 4.40–5.90)
RDW: 13.2 % (ref 11.5–14.5)
WBC: 5.7 10*3/uL (ref 3.8–10.6)

## 2018-04-23 LAB — GLUCOSE, CAPILLARY: Glucose-Capillary: 149 mg/dL — ABNORMAL HIGH (ref 70–99)

## 2018-04-23 NOTE — Discharge Instructions (Addendum)
Fortunately today your blood work was reassuring however a hemoglobin A1c of 13 is extremely abnormal.  Please make an appointment to follow-up with your primary care physician this coming week for a reexamination and return to the emergency department sooner for any concerns.  It was a pleasure to take care of you today, and thank you for coming to our emergency department.  If you have any questions or concerns before leaving please ask the nurse to grab me and I'm more than happy to go through your aftercare instructions again.  If you were prescribed any opioid pain medication today such as Norco, Vicodin, Percocet, morphine, hydrocodone, or oxycodone please make sure you do not drive when you are taking this medication as it can alter your ability to drive safely.  If you have any concerns once you are home that you are not improving or are in fact getting worse before you can make it to your follow-up appointment, please do not hesitate to call 911 and come back for further evaluation.  Tytiana Coles, MD  Results Merrily Brittlefor orders placed or performed during the hospital encounter of 04/23/18  Basic metabolic panel  Result Value Ref Range   Sodium 139 135 - 145 mmol/L   Potassium 3.7 3.5 - 5.1 mmol/L   Chloride 106 98 - 111 mmol/L   CO2 26 22 - 32 mmol/L   Glucose, Bld 174 (H) 70 - 99 mg/dL   BUN 10 6 - 20 mg/dL   Creatinine, Ser 1.610.84 0.61 - 1.24 mg/dL   Calcium 8.6 (L) 8.9 - 10.3 mg/dL   GFR calc non Af Amer >60 >60 mL/min   GFR calc Af Amer >60 >60 mL/min   Anion gap 7 5 - 15  CBC  Result Value Ref Range   WBC 5.7 3.8 - 10.6 K/uL   RBC 4.86 4.40 - 5.90 MIL/uL   Hemoglobin 14.8 13.0 - 18.0 g/dL   HCT 09.642.9 04.540.0 - 40.952.0 %   MCV 88.3 80.0 - 100.0 fL   MCH 30.5 26.0 - 34.0 pg   MCHC 34.6 32.0 - 36.0 g/dL   RDW 81.113.2 91.411.5 - 78.214.5 %   Platelets 249 150 - 440 K/uL  Urinalysis, Complete w Microscopic  Result Value Ref Range   Color, Urine YELLOW (A) YELLOW   APPearance CLEAR (A) CLEAR   Specific Gravity, Urine 1.023 1.005 - 1.030   pH 7.0 5.0 - 8.0   Glucose, UA 150 (A) NEGATIVE mg/dL   Hgb urine dipstick NEGATIVE NEGATIVE   Bilirubin Urine NEGATIVE NEGATIVE   Ketones, ur NEGATIVE NEGATIVE mg/dL   Protein, ur NEGATIVE NEGATIVE mg/dL   Nitrite NEGATIVE NEGATIVE   Leukocytes, UA NEGATIVE NEGATIVE   RBC / HPF 0-5 0 - 5 RBC/hpf   WBC, UA 0-5 0 - 5 WBC/hpf   Bacteria, UA NONE SEEN NONE SEEN   Squamous Epithelial / LPF 0-5 0 - 5   Mucus PRESENT   Glucose, capillary  Result Value Ref Range   Glucose-Capillary 149 (H) 70 - 99 mg/dL

## 2018-04-23 NOTE — ED Triage Notes (Signed)
Patient reports feeling very weak and tired x 1 week.  Patient states he has been taking his blood pressure and diabetes medication but neither have been well controlled.  Patient reports blood sugar has been in the 500s and 600s.  Patient also is requesting an HIV screening as well.

## 2018-04-23 NOTE — ED Notes (Signed)
Pt c/o generalized weakness x 1 week. States he has felt his pulse in his left ear x 1 month. Denies nausea/vomiting/diarrhea. Denies dizziness. States he feels like he is not able to get enough sleep. Denies chest pain. Denies shortness of breath.

## 2018-04-23 NOTE — ED Provider Notes (Signed)
[facility] Emergency Department Provider Note  ____________________________________________   First MD Initiated Contact with Patient 04/23/18 1554     (approximate)  I have reviewed the triage vital signs and the nursing notes.   HISTORY  Chief Complaint Fatigue and Hyperglycemia   HPI Jason Clarke is a 52 y.o. male who self presents to the emergency department with generalized malaise and fatigue.  He has a long-standing history of diabetes mellitus that is poorly controlled with a last known hemoglobin A1c over 13.  His blood sugar has been in the 5 and 600s this week which prompted the visit to the emergency department.  He is also requesting an HIV screening.  He says he has not followed up with his primary care physician because he does not "have the time".  He does report polyuria and polydipsia.  His symptoms have been constant.  They are mild to moderate.  Nothing seems to make them better or worse.    Past Medical History:  Diagnosis Date  . Back pain   . Depression   . Diabetes mellitus without complication (HCC)   . Hypertension   . Neuropathy   . Obesity     There are no active problems to display for this patient.   Past Surgical History:  Procedure Laterality Date  . CHOLECYSTECTOMY      Prior to Admission medications   Medication Sig Start Date End Date Taking? Authorizing Provider  acetaminophen-codeine (TYLENOL #3) 300-30 MG tablet Take 1 tablet by mouth every 6 (six) hours as needed for moderate pain. 12/23/17   Menshew, Charlesetta IvoryJenise V Bacon, PA-C  aspirin 81 MG tablet Take 81 mg by mouth daily.    [provider]  azithromycin (ZITHROMAX Z-PAK) 250 MG tablet Take 2 tablets (500 mg) on  Day 1,  followed by 1 tablet (250 mg) once daily on Days 2 through 5. 12/23/17   Menshew, Charlesetta IvoryJenise V Bacon, PA-C  benzonatate (TESSALON PERLES) 100 MG capsule Take 1-2 tabs TID prn cough 12/23/17   Menshew, Charlesetta IvoryJenise V Bacon, PA-C    enalapril-hydrochlorothiazide (VASERETIC) 10-25 MG tablet Take 1 tablet by mouth daily. 05/21/17   Irean HongSung, Jade J, MD  fexofenadine-pseudoephedrine (ALLEGRA-D ALLERGY & CONGESTION) 180-240 MG 24 hr tablet Take 1 tablet by mouth daily. 11/11/16   Hassan RowanWade, Eugene, MD  fluticasone Aleda Grana(FLONASE) 50 MCG/ACT nasal spray Place into both nostrils daily.    [provider]  fluticasone (FLONASE) 50 MCG/ACT nasal spray Place 2 sprays into both nostrils daily. 12/23/17   Menshew, Charlesetta IvoryJenise V Bacon, PA-C  insulin glargine (LANTUS) 100 UNIT/ML injection Inject 20 Units into the skin 2 (two) times daily.     [provider]  loratadine (CLARITIN) 10 MG tablet Take 10 mg by mouth daily.    [provider]  lovastatin (MEVACOR) 10 MG tablet Take 1 tablet (10 mg total) by mouth at bedtime. 05/21/17   Minna AntisPaduchowski, Kevin, MD  metFORMIN (GLUCOPHAGE) 500 MG tablet Take 1 tablet (500 mg total) by mouth 2 (two) times daily with a meal. Patient taking differently: Take 1,000 mg by mouth 2 (two) times daily with a meal.  05/21/17   Minna AntisPaduchowski, Kevin, MD    Allergies Patient has no known allergies.  Family History  Problem Relation Age of Onset  . Diabetes Mother   . Hypertension Mother   . Diabetes Father   . Hypertension Father   . Diabetes Maternal Grandmother   . Hypertension Maternal Grandmother   .  Diabetes Paternal Grandmother   . Hypertension Paternal Grandmother     Social History Social History   Tobacco Use  . Smoking status: Never Smoker  . Smokeless tobacco: Never Used  Substance Use Topics  . Alcohol use: Not Currently    Comment: occ  . Drug use: No    Review of Systems Constitutional: No fever/chills Eyes: No visual changes. ENT: No sore throat. Cardiovascular: Denies chest pain. Respiratory: Denies shortness of breath. Gastrointestinal: No abdominal pain.  No nausea, no vomiting.  No diarrhea.  No constipation. Genitourinary: Negative for dysuria. Musculoskeletal:  Negative for back pain. Skin: Negative for rash. Neurological: Negative for headaches, focal weakness or numbness.   ____________________________________________   PHYSICAL EXAM:  VITAL SIGNS: ED Triage Vitals  Enc Vitals Group     BP 04/23/18 1406 (!) 158/93     Pulse Rate 04/23/18 1406 69     Resp 04/23/18 1406 18     Temp 04/23/18 1406 98.5 F (36.9 C)     Temp Source 04/23/18 1406 Oral     SpO2 04/23/18 1406 97 %     Weight 04/23/18 1412 275 lb (124.7 kg)     Height 04/23/18 1412 5\' 9"  (1.753 m)     Head Circumference --      Peak Flow --      Pain Score 04/23/18 1411 6     Pain Loc --      Pain Edu? --      Excl. in GC? --     Constitutional: Alert and oriented x4 nontoxic no diaphoresis Eyes: PERRL EOMI. Head: Atraumatic. Nose: No congestion/rhinnorhea. Mouth/Throat: No trismus Neck: No stridor.   Cardiovascular: Normal rate, regular rhythm. Grossly normal heart sounds.  Good peripheral circulation. Respiratory: Normal respiratory effort.  No retractions. Lungs CTAB and moving good air Gastrointestinal: Soft nontender Musculoskeletal: No lower extremity edema   Neurologic:  Normal speech and language. No gross focal neurologic deficits are appreciated. Skin:  Skin is warm, dry and intact. No rash noted. Psychiatric: Mood and affect are normal. Speech and behavior are normal.    ____________________________________________   DIFFERENTIAL includes but not limited to  Hyper glycemia, DKA, HHS ____________________________________________   LABS (all labs ordered are listed, but only abnormal results are displayed)  Labs Reviewed  BASIC METABOLIC PANEL - Abnormal; Notable for the following components:      Result Value   Glucose, Bld 174 (*)    Calcium 8.6 (*)    All other components within normal limits  URINALYSIS, COMPLETE (UACMP) WITH MICROSCOPIC - Abnormal; Notable for the following components:   Color, Urine YELLOW (*)    APPearance CLEAR (*)     Glucose, UA 150 (*)    All other components within normal limits  GLUCOSE, CAPILLARY - Abnormal; Notable for the following components:   Glucose-Capillary 149 (*)    All other components within normal limits  CBC  RAPID HIV SCREEN (HIV 1/2 AB+AG)    Lab work reviewed by me is HIV negative and no signs of DKA __________________________________________  EKG   ____________________________________________  RADIOLOGY   ____________________________________________   PROCEDURES  Procedure(s) performed: no  Procedures  Critical Care performed: no  ____________________________________________   INITIAL IMPRESSION / ASSESSMENT AND PLAN / ED COURSE  Pertinent labs & imaging results that were available during my care of the patient were reviewed by me and considered in my medical decision making (see chart for details).   As part of my medical decision making,  I reviewed the following data within the electronic MEDICAL RECORD NUMBER History obtained from family if available, nursing notes, old chart and ekg, as well as notes from prior ED visits.  The patient is overall relatively well-appearing with asymptomatic hyperglycemia.  He and I discussed that a hemoglobin A1c of 13 is unacceptable.  HIV test is fortunately negative which has reassured the patient considerably.  We discussed medication management but as he does have a primary care physician I will defer this to the primary.  Discharged home in improved condition.      ____________________________________________   FINAL CLINICAL IMPRESSION(S) / ED DIAGNOSES  Final diagnoses:  Dehydration  Fatigue, unspecified type  Hyperglycemia      NEW MEDICATIONS STARTED DURING THIS VISIT:  Discharge Medication List as of 04/23/2018  4:19 PM       Note:  This document was prepared using Dragon voice recognition software and may include unintentional dictation errors.     Merrily Brittle, MD 04/26/18 1312

## 2018-04-23 NOTE — ED Notes (Signed)
Patient states he hasn't eaten anything today due to high blood sugar.

## 2018-09-06 ENCOUNTER — Encounter (HOSPITAL_COMMUNITY): Payer: Self-pay | Admitting: *Deleted

## 2018-09-06 ENCOUNTER — Emergency Department (HOSPITAL_COMMUNITY)
Admission: EM | Admit: 2018-09-06 | Discharge: 2018-09-06 | Disposition: A | Discharge status: Home / Self Care | Payer: Medicaid Other | Attending: Emergency Medicine | Admitting: Emergency Medicine

## 2018-09-06 DIAGNOSIS — H1089 Other conjunctivitis: Secondary | ICD-10-CM | POA: Insufficient documentation

## 2018-09-06 DIAGNOSIS — H6690 Otitis media, unspecified, unspecified ear: Secondary | ICD-10-CM

## 2018-09-06 DIAGNOSIS — H109 Unspecified conjunctivitis: Secondary | ICD-10-CM

## 2018-09-06 DIAGNOSIS — J069 Acute upper respiratory infection, unspecified: Secondary | ICD-10-CM | POA: Insufficient documentation

## 2018-09-06 DIAGNOSIS — H6692 Otitis media, unspecified, left ear: Secondary | ICD-10-CM | POA: Insufficient documentation

## 2018-09-06 MED ORDER — FLUORESCEIN SODIUM 1 MG OP STRP
1.0000 | ORAL_STRIP | Freq: Once | OPHTHALMIC | Status: AC
  Administered 2018-09-06: 1 via OPHTHALMIC
  Filled 2018-09-06: qty 1

## 2018-09-06 MED ORDER — ALBUTEROL SULFATE HFA 108 (90 BASE) MCG/ACT IN AERS: 1.0000 | INHALATION_SPRAY | Freq: Four times a day (QID) | RESPIRATORY_TRACT | 0 refills | Status: AC | PRN

## 2018-09-06 MED ORDER — OFLOXACIN 0.3 % OT SOLN: 5.0000 [drp] | Freq: Two times a day (BID) | OTIC | 0 refills | Status: AC

## 2018-09-06 MED ORDER — AMOXICILLIN-POT CLAVULANATE 875-125 MG PO TABS: 1.0000 | ORAL_TABLET | Freq: Two times a day (BID) | ORAL | 0 refills | Status: DC

## 2018-09-06 MED ORDER — TETRACAINE HCL 0.5 % OP SOLN
1.0000 [drp] | Freq: Once | OPHTHALMIC | Status: AC
  Administered 2018-09-06: 1 [drp] via OPHTHALMIC
  Filled 2018-09-06: qty 4

## 2018-09-06 NOTE — ED Notes (Signed)
ED Provider at bedside. 

## 2018-09-06 NOTE — Discharge Instructions (Signed)
Take antibiotics as directed. Please take all of your antibiotics until finished.  Use eyedrops as directed.  As we discussed, please follow-up with referred ophthalmologist after your symptoms have improved to have your eye pressures rechecked.  Today they were slightly high in both eyes.  Please have them recheck this.  Return the emergency department for any fever, difficulty breathing, chest pain, vision changes or any other worsening or concerning symptoms.

## 2018-09-06 NOTE — ED Triage Notes (Signed)
Pt in c/o bilateral eye redness, sore throat, bilateral earache and nasal congestion

## 2018-09-06 NOTE — ED Provider Notes (Signed)
[facility] Provider Note   CSN: [id number] Arrival date & time: 09/06/18  1830     History   Chief Complaint Chief Complaint  Patient presents with  . Eye Pain    HPI Jason Clarke is a 52 y.o. male with PMH/o HTN, Depression, who presents for evaluation of 2 weeks of nasal congestion, rhinorrhea, sinus pressure and eye irritation. He reports that at onset of symptoms he had cough but that has improved. He comes in today because he continues to have symptoms. He reports that he has had mucous discharge from both eyes. He states he was woken up with "eye boogers." He has not had any trauma/injury to eyes. He reports that he wears glasses but does not wear contacts. He states he has still been able to see without any difficulty. Patient also reports some bilateral ear pain.  Denies any fevers, chest pain, difficulty breathing.  The history is provided by the patient.    Past Medical History:  Diagnosis Date  . Back pain   . Depression   . Diabetes mellitus without complication (HCC)   . Hypertension   . Neuropathy   . Obesity     There are no active problems to display for this patient.   Past Surgical History:  Procedure Laterality Date  . CHOLECYSTECTOMY          Home Medications    Prior to Admission medications   Medication Sig Start Date End Date Taking? Authorizing Provider  acetaminophen-codeine (TYLENOL #3) 300-30 MG tablet Take 1 tablet by mouth every 6 (six) hours as needed for moderate pain. 12/23/17   Menshew, Charlesetta IvoryJenise V Bacon, PA-C  albuterol (PROVENTIL HFA;VENTOLIN HFA) 108 (90 Base) MCG/ACT inhaler Inhale 1-2 puffs into the lungs every 6 (six) hours as needed for wheezing or shortness of breath. 09/06/18   Maxwell CaulLayden, Lindsey A, PA-C  amoxicillin-clavulanate (AUGMENTIN) 875-125 MG tablet Take 1 tablet by mouth every 12 (twelve) hours. 09/06/18   Maxwell CaulLayden, Lindsey A, PA-C  aspirin 81 MG tablet Take 81 mg by mouth  daily.    [provider]  azithromycin (ZITHROMAX Z-PAK) 250 MG tablet Take 2 tablets (500 mg) on  Day 1,  followed by 1 tablet (250 mg) once daily on Days 2 through 5. 12/23/17   Menshew, Charlesetta IvoryJenise V Bacon, PA-C  benzonatate (TESSALON PERLES) 100 MG capsule Take 1-2 tabs TID prn cough 12/23/17   Menshew, Charlesetta IvoryJenise V Bacon, PA-C  enalapril-hydrochlorothiazide (VASERETIC) 10-25 MG tablet Take 1 tablet by mouth daily. 05/21/17   Irean HongSung, Jade J, MD  fexofenadine-pseudoephedrine (ALLEGRA-D ALLERGY & CONGESTION) 180-240 MG 24 hr tablet Take 1 tablet by mouth daily. 11/11/16   Hassan RowanWade, Eugene, MD  fluticasone Aleda Grana(FLONASE) 50 MCG/ACT nasal spray Place into both nostrils daily.    [provider]  fluticasone (FLONASE) 50 MCG/ACT nasal spray Place 2 sprays into both nostrils daily. 12/23/17   Menshew, Charlesetta IvoryJenise V Bacon, PA-C  insulin glargine (LANTUS) 100 UNIT/ML injection Inject 20 Units into the skin 2 (two) times daily.     [provider]  loratadine (CLARITIN) 10 MG tablet Take 10 mg by mouth daily.    [provider]  lovastatin (MEVACOR) 10 MG tablet Take 1 tablet (10 mg total) by mouth at bedtime. 05/21/17   Minna AntisPaduchowski, Kevin, MD  metFORMIN (GLUCOPHAGE) 500 MG tablet Take 1 tablet (500 mg total) by mouth 2 (two) times daily with a meal. Patient taking differently: Take 1,000 mg by mouth 2 (  two) times daily with a meal.  05/21/17   Minna Antis, MD  ofloxacin (FLOXIN) 0.3 % OTIC solution Place 5 drops into both ears 2 (two) times daily. 09/06/18   Maxwell Caul, PA-C    Family History Family History  Problem Relation Age of Onset  . Diabetes Mother   . Hypertension Mother   . Diabetes Father   . Hypertension Father   . Diabetes Maternal Grandmother   . Hypertension Maternal Grandmother   . Diabetes Paternal Grandmother   . Hypertension Paternal Grandmother     Social History Social History   Tobacco Use  . Smoking status: Never Smoker  . Smokeless tobacco: Never  Used  Substance Use Topics  . Alcohol use: Not Currently    Comment: occ  . Drug use: No     Allergies   Patient has no known allergies.   Review of Systems Review of Systems  Constitutional: Negative for fever.  HENT: Positive for congestion, ear pain, rhinorrhea and sinus pressure.   Eyes: Positive for discharge and redness. Negative for pain.  Respiratory: Negative for shortness of breath.   Cardiovascular: Negative for chest pain.  All other systems reviewed and are negative.    Physical Exam Updated Vital Signs BP (!) 170/104 (BP Location: Left Arm)   Pulse 88   Temp 98.2 F (36.8 C) (Oral)   Resp 17   SpO2 96%   Physical Exam  Constitutional: He appears well-developed and well-nourished.  HENT:  Head: Normocephalic and atraumatic.  Right Ear: Tympanic membrane normal.  Left Ear: Tympanic membrane is erythematous and bulging.  Nose: Mucosal edema present. Right sinus exhibits frontal sinus tenderness. Left sinus exhibits frontal sinus tenderness.  Edematous and erythematous nasal turbinates bilaterally.  Posterior oropharynx is slightly erythematous.  No exudates, edema.  Uvula is midline.  No trismus.  Airways patent, phonation is intact. Right TM normal. Left TM with erythema and bulging.   Eyes: Pupils are equal, round, and reactive to light. EOM and lids are normal. Right eye exhibits discharge. Left eye exhibits discharge. Right conjunctiva is injected. Left conjunctiva is injected. No scleral icterus.  Fundoscopic exam:      The right eye shows no papilledema.       The left eye shows no papilledema.  Slit lamp exam:      The right eye shows no corneal abrasion.       The left eye shows no corneal abrasion.  EOMS intact without any difficulty. PERRL. No warmth, erythema, edema noted to bilateral periorbital regions.  Mucus discharge present.  Pulmonary/Chest: Effort normal.  Neurological: He is alert.  Skin: Skin is warm and dry.  Psychiatric: He has a  normal mood and affect. His speech is normal and behavior is normal.  Nursing note and vitals reviewed.    ED Treatments / Results  Labs (all labs ordered are listed, but only abnormal results are displayed) Labs Reviewed - No data to display  EKG None  Radiology No results found.  Procedures Procedures (including critical care time)  Medications Ordered in ED Medications  tetracaine (PONTOCAINE) 0.5 % ophthalmic solution 1 drop (1 drop Both Eyes Given 09/06/18 1954)  fluorescein ophthalmic strip 1 strip (1 strip Both Eyes Given by Other 09/06/18 1954)     Initial Impression / Assessment and Plan / ED Course  I have reviewed the triage vital signs and the nursing notes.  Pertinent labs & imaging results that were available during my care of the patient  were reviewed by me and considered in my medical decision making (see chart for details).     52 y.o. M with 2 weeks of eye irritation, rhinorrhea, eye irritation, eye discharge.  No fevers.  Initially had some cough that has improved.  Reports that he is waking up with discharge in both eyes.  He has some irritation but no blurry vision.  He does not have any history of trauma and does not wear contacts. Patient is afebrile, non-toxic appearing, sitting comfortably on examination table. Vital signs reviewed.  He is slightly hypertensive.  He does report that he took his medications today.  He is not complaining of any chest pain or difficulty breathing.  Consider conjunctivitis given symptoms in both eyes.  Low suspicion for corneal abrasion but will evaluate.  Additionally, history/physical exam not concerning for glaucoma.  History/physical exam not concerning for preseptal or orbital cellulitis.   Evaluation with Joseph Art lamp shows no evidence of corneal abrasion.  No evidence of floor seen uptake.  Negative Seidel sign.  No evidence of dendritic lesions.  Intraocular pressure as documented below:  Left IOP: 16, 22, 20 Right IOP:  28, 22, 14     Visual Acuity  Right Eye Distance: 20/63 Left Eye Distance: 20/32 Bilateral Distance: 20/25   At this time, suspect that his symptoms are likely related to conjunctivitis.  We will plan to give him eyedrops to help with symptoms.  Additionally, patient has left TM exam concerning for acute otitis media.  Will plan to discharge on antibiotics given duration of symptoms.  Additionally, discussed with patient that his IOP's were slightly high here in the ED.  He did have some normal readings but also had some elevated readings.  Pupils are reacting without any difficulty.  I discussed with patient that he will likely need ophthalmology follow-up on an outpatient basis to follow-up with increased IOP's. At this time, patient exhibits no emergent life-threatening condition that require further evaluation in ED. Patient had ample opportunity for questions and discussion. All patient's questions were answered with full understanding. Strict return precautions discussed. Patient expresses understanding and agreement to plan.    Final Clinical Impressions(s) / ED Diagnoses   Final diagnoses:  Bacterial conjunctivitis  Upper respiratory tract infection, unspecified type  Acute otitis media, unspecified otitis media type    ED Discharge Orders         Ordered    ofloxacin (FLOXIN) 0.3 % OTIC solution  2 times daily     09/06/18 2030    amoxicillin-clavulanate (AUGMENTIN) 875-125 MG tablet  Every 12 hours     09/06/18 2030    albuterol (PROVENTIL HFA;VENTOLIN HFA) 108 (90 Base) MCG/ACT inhaler  Every 6 hours PRN     09/06/18 2039           Maxwell Caul, PA-C 09/06/18 2229    Vanetta Mulders, MD 09/07/18 0000

## 2020-05-08 ENCOUNTER — Other Ambulatory Visit: Payer: Medicaid Other

## 2020-06-05 ENCOUNTER — Other Ambulatory Visit: Payer: Medicaid Other

## 2020-06-07 ENCOUNTER — Ambulatory Visit: Admit: 2020-06-07 | Payer: Medicaid Other | Admitting: Podiatry

## 2020-06-07 SURGERY — BONE EXCISION
Anesthesia: Choice | Laterality: Left

## 2021-01-10 ENCOUNTER — Encounter: Payer: Self-pay | Admitting: Neurology
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
## 2021-03-19 ENCOUNTER — Ambulatory Visit (INDEPENDENT_AMBULATORY_CARE_PROVIDER_SITE_OTHER): Payer: No Typology Code available for payment source | Admitting: Neurology

## 2021-03-19 ENCOUNTER — Other Ambulatory Visit: Payer: Self-pay

## 2021-03-19 ENCOUNTER — Encounter: Payer: Self-pay | Admitting: Neurology

## 2021-03-19 VITALS — BP 182/84 | HR 74 | Ht 69.0 in | Wt 268.0 lb

## 2021-03-19 DIAGNOSIS — I10 Essential (primary) hypertension: Secondary | ICD-10-CM

## 2021-03-19 DIAGNOSIS — F0781 Postconcussional syndrome: Secondary | ICD-10-CM | POA: Diagnosis not present

## 2021-03-19 DIAGNOSIS — G44329 Chronic post-traumatic headache, not intractable: Secondary | ICD-10-CM | POA: Diagnosis not present

## 2021-03-19 MED ORDER — NORTRIPTYLINE HCL 10 MG PO CAPS: 10.0000 mg | ORAL_CAPSULE | Freq: Every day | ORAL | 5 refills | Status: DC

## 2021-03-19 NOTE — Patient Instructions (Signed)
Start nortriptyline 10mg  at bedtime.  If no improvement in 4 weeks, contact me and we can increase dose.  Limit use of pain relievers to no more than 2 days out of week to prevent risk of rebound or medication-overuse headache. Try getting a blue light filter, either a covering over the computer screen or glasses Keep headache diary Follow up 6 months.

## 2021-04-10 ENCOUNTER — Other Ambulatory Visit: Payer: Self-pay | Admitting: Neurology
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
## 2021-10-01 ENCOUNTER — Other Ambulatory Visit: Payer: Self-pay

## 2021-10-01 ENCOUNTER — Telehealth: Payer: No Typology Code available for payment source | Admitting: Neurology

## 2023-10-01 ENCOUNTER — Emergency Department (HOSPITAL_BASED_OUTPATIENT_CLINIC_OR_DEPARTMENT_OTHER): Payer: Self-pay

## 2023-10-01 ENCOUNTER — Emergency Department (HOSPITAL_BASED_OUTPATIENT_CLINIC_OR_DEPARTMENT_OTHER)
Admission: EM | Admit: 2023-10-01 | Discharge: 2023-10-01 | Disposition: A | Discharge status: Home / Self Care | Payer: No Typology Code available for payment source | Attending: Emergency Medicine | Admitting: Emergency Medicine

## 2023-10-01 ENCOUNTER — Emergency Department (HOSPITAL_BASED_OUTPATIENT_CLINIC_OR_DEPARTMENT_OTHER): Payer: No Typology Code available for payment source

## 2023-10-01 ENCOUNTER — Other Ambulatory Visit: Payer: Self-pay

## 2023-10-01 ENCOUNTER — Emergency Department (HOSPITAL_BASED_OUTPATIENT_CLINIC_OR_DEPARTMENT_OTHER): Payer: No Typology Code available for payment source | Admitting: Radiology

## 2023-10-01 ENCOUNTER — Encounter (HOSPITAL_BASED_OUTPATIENT_CLINIC_OR_DEPARTMENT_OTHER): Payer: Self-pay | Admitting: Emergency Medicine

## 2023-10-01 DIAGNOSIS — Z20822 Contact with and (suspected) exposure to covid-19: Secondary | ICD-10-CM | POA: Diagnosis not present

## 2023-10-01 DIAGNOSIS — I10 Essential (primary) hypertension: Secondary | ICD-10-CM | POA: Diagnosis not present

## 2023-10-01 DIAGNOSIS — M545 Low back pain, unspecified: Secondary | ICD-10-CM | POA: Insufficient documentation

## 2023-10-01 DIAGNOSIS — M25551 Pain in right hip: Secondary | ICD-10-CM | POA: Insufficient documentation

## 2023-10-01 DIAGNOSIS — Y92481 Parking lot as the place of occurrence of the external cause: Secondary | ICD-10-CM | POA: Insufficient documentation

## 2023-10-01 DIAGNOSIS — E119 Type 2 diabetes mellitus without complications: Secondary | ICD-10-CM | POA: Insufficient documentation

## 2023-10-01 DIAGNOSIS — Z7984 Long term (current) use of oral hypoglycemic drugs: Secondary | ICD-10-CM | POA: Diagnosis not present

## 2023-10-01 DIAGNOSIS — M542 Cervicalgia: Secondary | ICD-10-CM | POA: Insufficient documentation

## 2023-10-01 DIAGNOSIS — Z7982 Long term (current) use of aspirin: Secondary | ICD-10-CM | POA: Diagnosis not present

## 2023-10-01 DIAGNOSIS — R053 Chronic cough: Secondary | ICD-10-CM

## 2023-10-01 DIAGNOSIS — M25511 Pain in right shoulder: Secondary | ICD-10-CM | POA: Insufficient documentation

## 2023-10-01 LAB — SARS CORONAVIRUS 2 BY RT PCR: SARS Coronavirus 2 by RT PCR: NEGATIVE

## 2023-10-01 MED ORDER — KETOROLAC TROMETHAMINE 60 MG/2ML IM SOLN
30.0000 mg | Freq: Once | INTRAMUSCULAR | Status: AC
  Administered 2023-10-01: 30 mg via INTRAMUSCULAR
  Filled 2023-10-01: qty 2
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
## 2023-10-01 NOTE — ED Triage Notes (Signed)
 Pt reports being in [facility] 12/28 reports he was fine until this morning he woke up and had neck, back R hip pain. Pt also reports cough for a few weeks.
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
